# Patient Record
Sex: Female | Born: 2005 | Race: Black or African American | Hispanic: No | Marital: Single | Smoking: Never smoker
Health system: Southern US, Community
[De-identification: ages and names within clinical notes are randomized; demographics above are authoritative.]

---
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
## 2023-01-15 ENCOUNTER — Ambulatory Visit
Admission: EM | Admit: 2023-01-15 | Discharge: 2023-01-15 | Disposition: A | Discharge status: Home / Self Care | Payer: BC Managed Care – PPO | Attending: Emergency Medicine | Admitting: Emergency Medicine

## 2023-01-15 DIAGNOSIS — J029 Acute pharyngitis, unspecified: Secondary | ICD-10-CM | POA: Diagnosis present

## 2023-01-15 LAB — GROUP A STREP BY PCR: Group A Strep by PCR: NOT DETECTED

## 2023-01-15 NOTE — ED Provider Notes (Signed)
[facility]    CSN: [id number] Arrival date & time: 01/15/23  1017      History   Chief Complaint Chief Complaint  Patient presents with   Sore Throat    HPI Connie Walton is a 17 y.o. female.   HPI  17 year old female with no significant past medical history presents for evaluation of 4 days worth of sore throat.  She has noticed that she has had some hot and cold flashes but she has not measured a fever at home.  She did have some mild nasal congestion which is now resolved but she denies runny nose.  She is also had an infrequent nonproductive cough.  She is unaware of any other sick contacts.  No recent travel.  History reviewed. No pertinent past medical history.  There are no problems to display for this patient.   History reviewed. No pertinent surgical history.  OB History   No obstetric history on file.      Home Medications    Prior to Admission medications   Not on File    Family History History reviewed. No pertinent family history.  Social History Social History   Tobacco Use   Smoking status: Never   Smokeless tobacco: Never  Substance Use Topics   Alcohol use: No   Drug use: No     Allergies   Patient has no known allergies.   Review of Systems Review of Systems  Constitutional:  Positive for chills. Negative for fever.  HENT:  Positive for congestion and sore throat. Negative for rhinorrhea.   Respiratory:  Positive for cough.      Physical Exam Triage Vital Signs ED Triage Vitals  Encounter Vitals Group     BP      Systolic BP Percentile      Diastolic BP Percentile      Pulse      Resp      Temp      Temp src      SpO2      Weight      Height      Head Circumference      Peak Flow      Pain Score      Pain Loc      Pain Education      Exclude from Growth Chart    No data found.  Updated Vital Signs BP (!) 116/96 (BP Location: Left Arm)   Pulse 93   Temp 98.8 F (37.1 C) (Oral)   Resp 16    SpO2 95%   Visual Acuity Right Eye Distance:   Left Eye Distance:   Bilateral Distance:    Right Eye Near:   Left Eye Near:    Bilateral Near:     Physical Exam Vitals and nursing note reviewed.  Constitutional:      Appearance: Normal appearance. She is not ill-appearing.  HENT:     Head: Normocephalic and atraumatic.     Right Ear: Tympanic membrane, ear canal and external ear normal. There is no impacted cerumen.     Left Ear: Tympanic membrane, ear canal and external ear normal. There is no impacted cerumen.     Nose: Congestion and rhinorrhea present.     Comments: His mucosa is erythematous and edematous with scant clear discharge in both nares.    Mouth/Throat:     Mouth: Mucous membranes are moist.     Pharynx: Oropharyngeal exudate and posterior oropharyngeal erythema present.  Comments: Bilateral tonsillar pillars are 2+ edematous and erythematous with white exudate. Neck:     Comments: Bilateral anterior, nontender cervical lymphadenopathy present on exam. Cardiovascular:     Rate and Rhythm: Normal rate and regular rhythm.     Pulses: Normal pulses.     Heart sounds: Normal heart sounds. No murmur heard.    No friction rub. No gallop.  Pulmonary:     Effort: Pulmonary effort is normal.     Breath sounds: Normal breath sounds. No wheezing, rhonchi or rales.  Musculoskeletal:     Cervical back: Normal range of motion and neck supple. No tenderness.  Lymphadenopathy:     Cervical: Cervical adenopathy present.  Skin:    General: Skin is warm and dry.     Capillary Refill: Capillary refill takes less than 2 seconds.     Findings: No rash.  Neurological:     General: No focal deficit present.     Mental Status: She is alert and oriented to person, place, and time.      UC Treatments / Results  Labs (all labs ordered are listed, but only abnormal results are displayed) Labs Reviewed  GROUP A STREP BY PCR    EKG   Radiology No results  found.  Procedures Procedures (including critical care time)  Medications Ordered in UC Medications - No data to display  Initial Impression / Assessment and Plan / UC Course  I have reviewed the triage vital signs and the nursing notes.  Pertinent labs & imaging results that were available during my care of the patient were reviewed by me and considered in my medical decision making (see chart for details).   Patient is a nontoxic-appearing 17 year old female presenting for evaluation for days worth of sore throat as outlined in HPI above.  She does have edematous and erythematous tonsillar pillars with white exudate.  She also has anterior cervical lymphadenopathy on exam.  She is unaware of any sick contacts or known strep positive contact.  She does endorse some nasal congestion and has inflamed nasal mucosa on exam.  She also endorses a infrequent nonproductive cough.  Her lungs are clear to auscultation all fields.  I will order strep PCR to evaluate for the presence of strep.  Strep PCR is negative.  I will discharge patient on the diagnosis of viral pharyngitis.  She can use over-the-counter Chloraseptic and Sucrets lozenges to help with throat discomfort as well as salt water gargles.  Additionally she can use over-the-counter Tylenol and or ibuprofen to help with pain.  Return precautions reviewed.   Final Clinical Impressions(s) / UC Diagnoses   Final diagnoses:  Viral pharyngitis     Discharge Instructions      Your strep test today was negative.  I believe that your sore throat is a result of a viral infection.  Please use the following measures and medications to help your symptoms.  Gargle with warm salt water 2-3 times a day to soothe your throat, aid in pain relief, and aid in healing.  Take over-the-counter Tylenol and/or ibuprofen according to the package instructions as needed for pain.  You can also use Chloraseptic or Sucrets lozenges, 1 lozenge every 2 hours  as needed for throat pain.  If you develop any new or worsening symptoms return for reevaluation.      ED Prescriptions   None    PDMP not reviewed this encounter.   Becky Augusta, NP 01/15/23 1120

## 2023-01-15 NOTE — Discharge Instructions (Addendum)
Your strep test today was negative.  I believe that your sore throat is a result of a viral infection.  Please use the following measures and medications to help your symptoms.  Gargle with warm salt water 2-3 times a day to soothe your throat, aid in pain relief, and aid in healing.  Take over-the-counter Tylenol and/or ibuprofen according to the package instructions as needed for pain.  You can also use Chloraseptic or Sucrets lozenges, 1 lozenge every 2 hours as needed for throat pain.  If you develop any new or worsening symptoms return for reevaluation.

## 2023-01-15 NOTE — ED Triage Notes (Signed)
Patient presents to UC for sore throat x 4 days. Treating with ibuprofen.   Denies fever.

## 2023-01-17 ENCOUNTER — Emergency Department (HOSPITAL_BASED_OUTPATIENT_CLINIC_OR_DEPARTMENT_OTHER)
Admission: EM | Admit: 2023-01-17 | Discharge: 2023-01-17 | Disposition: A | Discharge status: Home / Self Care | Payer: BC Managed Care – PPO | Source: Home / Self Care | Attending: Emergency Medicine | Admitting: Emergency Medicine

## 2023-01-17 ENCOUNTER — Encounter (HOSPITAL_BASED_OUTPATIENT_CLINIC_OR_DEPARTMENT_OTHER): Payer: Self-pay

## 2023-01-17 ENCOUNTER — Other Ambulatory Visit: Payer: Self-pay

## 2023-01-17 DIAGNOSIS — J029 Acute pharyngitis, unspecified: Secondary | ICD-10-CM

## 2023-01-17 DIAGNOSIS — Z1152 Encounter for screening for COVID-19: Secondary | ICD-10-CM | POA: Diagnosis not present

## 2023-01-17 DIAGNOSIS — J039 Acute tonsillitis, unspecified: Secondary | ICD-10-CM | POA: Insufficient documentation

## 2023-01-17 LAB — RESP PANEL BY RT-PCR (RSV, FLU A&B, COVID)  RVPGX2
Influenza A by PCR: NEGATIVE
Influenza B by PCR: NEGATIVE
Resp Syncytial Virus by PCR: NEGATIVE
SARS Coronavirus 2 by RT PCR: NEGATIVE

## 2023-01-17 LAB — GROUP A STREP BY PCR: Group A Strep by PCR: NOT DETECTED

## 2023-01-17 MED ORDER — AMOXICILLIN-POT CLAVULANATE 875-125 MG PO TABS: 1.0000 | ORAL_TABLET | Freq: Two times a day (BID) | ORAL | 0 refills | Status: AC

## 2023-01-17 MED ORDER — PREDNISONE 10 MG (21) PO TBPK: ORAL_TABLET | Freq: Every day | ORAL | 0 refills | Status: AC

## 2023-01-17 NOTE — Discharge Instructions (Signed)
You likely have viral pharyngitis with potential tonsillitis.  I sent antibiotic and steroids into the pharmacy for you.  For any worsening symptoms return to the emergency room otherwise establish care with the clinic listed above.

## 2023-01-17 NOTE — ED Notes (Signed)
Pt discharged to home using teachback Method. Discharge instructions have been discussed with patient and/or family members. Pt verbally acknowledges understanding d/c instructions, has been given opportunity for questions to be answered, and endorses comprehension to checkout at registration before leaving.  

## 2023-01-17 NOTE — ED Triage Notes (Signed)
Patient is having sore throat and fever for one week. She went to [facility] for same.

## 2023-01-17 NOTE — ED Provider Notes (Signed)
[facility] Provider Note   CSN: [id number] Arrival date & time: 01/17/23  1308     History  Chief Complaint  Patient presents with   Sore Throat    Connie Walton is a 17 y.o. female.  17 year old female presents today with weeklong symptoms including cough, sore throat, intermittent fever.  Mom is at bedside.  She has been tolerating p.o. intake.  No drooling.  She was seen at urgent care 2 days ago and was strep negative.  She is concerned she may need a bit more therapy given she has not improved.  Biggest concern is the pharyngitis.  The history is provided by the patient. No language interpreter was used.       Home Medications Prior to Admission medications   Medication Sig Start Date End Date Taking? Authorizing Provider  amoxicillin-clavulanate (AUGMENTIN) 875-125 MG tablet Take 1 tablet by mouth every 12 (twelve) hours. 01/17/23  Yes Keslee Harrington, PA-C  predniSONE (STERAPRED UNI-PAK 21 TAB) 10 MG (21) TBPK tablet Take by mouth daily. Take 6 tabs by mouth daily  for 2 days, then 5 tabs for 2 days, then 4 tabs for 2 days, then 3 tabs for 2 days, 2 tabs for 2 days, then 1 tab by mouth daily for 2 days 01/17/23  Yes Karie Mainland, Tywan Siever, PA-C      Allergies    Patient has no known allergies.    Review of Systems   Review of Systems  Constitutional:  Negative for fever.  HENT:  Positive for congestion and sore throat. Negative for drooling, trouble swallowing and voice change.   Respiratory:  Positive for cough. Negative for shortness of breath.   All other systems reviewed and are negative.   Physical Exam Updated Vital Signs BP 119/76 (BP Location: Left Arm)   Pulse 97   Temp 98.7 F (37.1 C) (Oral)   Resp 16   Ht 5\' 7"  (1.702 m)   Wt 63.5 kg   SpO2 99%   BMI 21.93 kg/m  Physical Exam Vitals and nursing note reviewed.  Constitutional:      General: She is not in acute distress.    Appearance: Normal appearance. She is not  ill-appearing.  HENT:     Head: Normocephalic and atraumatic.     Nose: Nose normal.     Mouth/Throat:     Mouth: Mucous membranes are moist.     Pharynx: Oropharyngeal exudate present. No posterior oropharyngeal erythema.  Eyes:     General: No scleral icterus.    Extraocular Movements: Extraocular movements intact.     Conjunctiva/sclera: Conjunctivae normal.  Cardiovascular:     Rate and Rhythm: Normal rate and regular rhythm.  Pulmonary:     Effort: Pulmonary effort is normal. No respiratory distress.     Breath sounds: Normal breath sounds. No wheezing or rales.  Musculoskeletal:        General: Normal range of motion.     Cervical back: Normal range of motion.  Skin:    General: Skin is warm and dry.  Neurological:     General: No focal deficit present.     Mental Status: She is alert. Mental status is at baseline.     ED Results / Procedures / Treatments   Labs (all labs ordered are listed, but only abnormal results are displayed) Labs Reviewed  RESP PANEL BY RT-PCR (RSV, FLU A&B, COVID)  [id number]  GROUP A STREP BY PCR    EKG  None  Radiology No results found.  Procedures Procedures    Medications Ordered in ED Medications - No data to display  ED Course/ Medical Decision Making/ A&P                                 Medical Decision Making Risk Prescription drug management.   17 year old female presents today for concern of continued symptoms after a week duration.  Biggest concern is the pharyngitis.  She does have exudates. normal speech.  Airway is patent.  No evidence of apparent peritonsillar abscess, retropharyngeal abscess.  No trismus.  Will provide Augmentin for potential tonsillitis as well as prednisone course.  Strict return precaution discussed.  Patient and mom voiced understanding and are in agreement with plan.   Final Clinical Impression(s) / ED Diagnoses Final diagnoses:  Viral pharyngitis  Tonsillitis    Rx / DC Orders ED  Discharge Orders          Ordered    amoxicillin-clavulanate (AUGMENTIN) 875-125 MG tablet  Every 12 hours        01/17/23 1440    predniSONE (STERAPRED UNI-PAK 21 TAB) 10 MG (21) TBPK tablet  Daily        01/17/23 1440              Marita [place], PA-C 01/17/23 1453    Alvira Monday, MD 01/17/23 2245
